# Patient Record
Sex: Male | Born: 2000 | Race: White | Hispanic: No | Marital: Single | Smoking: Never smoker
Health system: Southern US, Community
[De-identification: ages and names within clinical notes are randomized; demographics above are authoritative.]

## PROBLEM LIST (undated history)

## (undated) DIAGNOSIS — F909 Attention-deficit hyperactivity disorder, unspecified type: Secondary | ICD-10-CM

## (undated) DIAGNOSIS — K219 Gastro-esophageal reflux disease without esophagitis: Secondary | ICD-10-CM

## (undated) HISTORY — DX: Gastro-esophageal reflux disease without esophagitis: K21.9

## (undated) HISTORY — DX: Attention-deficit hyperactivity disorder, unspecified type: F90.9

## (undated) HISTORY — PX: OTHER SURGICAL HISTORY: SHX169

---
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
## 2020-01-26 ENCOUNTER — Ambulatory Visit: Payer: BC Managed Care – PPO

## 2020-06-19 ENCOUNTER — Telehealth: Payer: Self-pay | Admitting: Nurse Practitioner

## 2020-06-19 NOTE — Telephone Encounter (Signed)
Patients mother advised to treat the symptoms allergy med for drainage, throat spray. Can rotate ibuprofen and tylenol . Told to call us back if no better.

## 2020-06-19 NOTE — Telephone Encounter (Signed)
Lmtcb.

## 2021-07-23 ENCOUNTER — Ambulatory Visit: Payer: BC Managed Care – PPO | Admitting: Family

## 2021-07-23 ENCOUNTER — Encounter: Payer: Self-pay | Admitting: Family

## 2021-07-23 ENCOUNTER — Other Ambulatory Visit: Payer: Self-pay

## 2021-07-23 VITALS — BP 108/75 | HR 88 | Temp 97.8°F | Ht 69.0 in | Wt 189.2 lb

## 2021-07-23 DIAGNOSIS — L255 Unspecified contact dermatitis due to plants, except food: Secondary | ICD-10-CM

## 2021-07-23 MED ORDER — METHYLPREDNISOLONE ACETATE 80 MG/ML IJ SUSP
80.0000 mg | Freq: Once | INTRAMUSCULAR | Status: AC
  Administered 2021-07-23: 80 mg via INTRAMUSCULAR

## 2021-07-23 MED ORDER — TRIAMCINOLONE ACETONIDE 0.5 % EX OINT: 1.0000 "application " | TOPICAL_OINTMENT | Freq: Two times a day (BID) | CUTANEOUS | 0 refills | Status: AC

## 2021-07-23 NOTE — Patient Instructions (Addendum)
Poison Oak Dermatitis °Poison oak dermatitis is inflammation of the skin that is caused by contact with the chemicals in the leaves of the poison oak (Toxicodendron) plant. The skin reaction often includes redness, swelling, blisters, and extreme itching. °What are the causes? °This condition is caused by a specific chemical (urushiol) that is found in the sap of the poison oak plant. This chemical is sticky and can be easily spread to people, animals, and objects. You can get poison oak dermatitis by: °Having direct contact with a poison oak plant. °Touching animals, other people, or objects that have come in contact with poison oak and have the chemical on them. °What increases the risk? °This condition is more likely to develop in people who: °Are outdoors often in wooded or marshy areas. °Go outdoors without wearing protective clothing, such as closed shoes, long pants, and a long-sleeved shirt. °What are the signs or symptoms? °Symptoms of this condition include: °Redness of the skin. °Extreme itching. °A rash that often includes bumps and blisters. The rash usually appears 48 hours after exposure if you have been exposed before. If this is the first time you have been exposed, the rash may not appear until a week after exposure. °Swelling. This may occur if the reaction is more severe. °Symptoms usually last for 1-2 weeks. However, the first time you develop this condition, symptoms may last 3-4 weeks. °How is this diagnosed? °This condition may be diagnosed based on your symptoms and a physical exam. Your health care provider may also ask you about any recent outdoor activity. °How is this treated? °Treatment for this condition will vary depending on how severe it is. Treatment may include: °Hydrocortisone creams or calamine lotions to relieve itching. °Oatmeal baths to soothe the skin. °Over-the-counter antihistamine medicines to help reduce itching. °Steroid medicine taken by mouth (orally) for more severe  reactions. °Follow these instructions at home: °Medicines °Take or apply over-the-counter and prescription medicines only as told by your health care provider. °Use hydrocortisone creams or calamine lotion as needed to soothe the skin and relieve itching. °General instructions °Do not scratch or rub your skin. °Apply a cold, wet cloth (cold compress) to the affected areas or take baths in cool water. This will help with itching. Avoid hot baths and showers. °Take oatmeal baths as needed. Use colloidal oatmeal. You can get this at your local pharmacy or grocery store. Follow the instructions on the packaging. °While you have the rash, wash clothes right after you wear them. °Keep all follow-up visits as told by your health care provider. This is important. °How is this prevented? ° °Learn to identify the poison oak plant and avoid contact with the plant. This plant can be recognized by the number of leaves. Generally, poison oak has three leaves with flowering branches on a single stem. The leaves are often a bit fuzzy and have a toothlike edge. °If you have been exposed to poison oak, thoroughly wash your skin with soap and water right away. You have about 30 minutes to remove the plant resin before it will cause the rash. Be sure to wash under your fingernails because any plant resin there will continue to spread the rash. °When hiking or camping, wear clothes that will help you avoid exposure on the skin. This includes long pants, a long-sleeved shirt, tall socks, and hiking boots. You can also apply preventive lotion to your skin to help limit exposure. °If you suspect that your clothes or outdoor gear came in contact with   poison oak, rinse them off outside with a garden hose before bringing them inside your house. °When doing yard work or gardening, wear gloves, long sleeves, long pants, and boots. Wash your garden tools and gloves if they come in contact with poison oak. °If you suspect that your pet has come  into contact with poison oak, wash him or her with pet shampoo and water. Make sure you wear gloves while washing your pet. °Do not burn poison oak plants. This can release the chemical from the plant into the air and may cause a reaction on the skin or eyes, or in the lungs from breathing in the smoke. °Contact a health care provider if you have: °Open sores in the rash area. °More redness, swelling, or pain in the affected area. °Redness that spreads beyond the rash area. °Fluid, blood, or pus coming from the affected area. °A fever. °A rash over a large area of your body. °A rash on your eyes, mouth, or genitals. °A rash that does not improve after a few weeks. °Get help right away if: °Your face swells or your eyes swell shut. °You have trouble breathing. °You have trouble swallowing. °These symptoms may represent a serious problem that is an emergency. Do not wait to see if the symptoms will go away. Get medical help right away. Call your local emergency services (911 in the [place] Do not drive yourself to the hospital. °Summary °Poison oak dermatitis is inflammation of the skin that is caused by contact with the chemicals on the leaves of the poison oak (Toxicodendron) plant. °Symptoms of this condition include redness, extreme itching, a rash, and swelling. °Do not scratch or rub your skin. °Take or apply over-the-counter and prescription medicines only as told by your health care provider. °This information is not intended to replace advice given to you by your health care provider. Make sure you discuss any questions you have with your health care provider. °Document Revised: 02/19/2019 Document Reviewed: 11/27/2018 °Elsevier Patient Education © 2022 [employer] ° °

## 2021-07-23 NOTE — Progress Notes (Signed)
Subjective:    Patient ID: Ronnie [name], male    DOB: 09-Sep-2001, 20 y.o.   MRN: [id number]  Chief Complaint  Patient presents with   Poison Elinor Dodge it last Monday     Poison Lajoyce [place] This is a new problem. The current episode started 1 to 4 weeks ago. The problem has been gradually worsening since onset. The affected locations include the left wrist, right wrist, left ankle, right buttock and right ankle. The rash is characterized by redness and itchiness. He was exposed to plant contact. Pertinent negatives include no congestion, cough, fatigue, fever or shortness of breath. Past treatments include topical steroids and anti-itch cream. The treatment provided mild relief.     Review of Systems  Constitutional:  Negative for fatigue and fever.  HENT:  Negative for congestion.   Respiratory:  Negative for cough and shortness of breath.   All other systems reviewed and are negative.     Objective:   Physical Exam Vitals reviewed.  Constitutional:      General: He is not in acute distress.    Appearance: He is well-developed.  HENT:     Head: Normocephalic.     Right Ear: Tympanic membrane normal.     Left Ear: Tympanic membrane normal.  Eyes:     General:        Right eye: No discharge.        Left eye: No discharge.     Pupils: Pupils are equal, round, and reactive to light.  Neck:     Thyroid: No thyromegaly.  Cardiovascular:     Rate and Rhythm: Normal rate and regular rhythm.     Heart sounds: Normal heart sounds. No murmur heard. Pulmonary:     Effort: Pulmonary effort is normal. No respiratory distress.     Breath sounds: Normal breath sounds. No wheezing.  Abdominal:     General: Bowel sounds are normal. There is no distension.     Palpations: Abdomen is soft.     Tenderness: There is no abdominal tenderness.  Musculoskeletal:        General: No tenderness. Normal range of motion.     Cervical back: Normal range of motion and neck supple.  Skin:     General: Skin is warm and dry.     Findings: Erythema and rash present.          Comments: Erythemas rash on bilateral knees, ankles, and wrist  Neurological:     Mental Status: He is alert and oriented to person, place, and time.     Cranial Nerves: No cranial nerve deficit.     Deep Tendon Reflexes: Reflexes are normal and symmetric.  Psychiatric:        Behavior: Behavior normal.        Thought Content: Thought content normal.        Judgment: Judgment normal.     BP 108/75   Pulse 88   Temp 97.8 F (36.6 C) (Temporal)   Ht 5\' 9"  (1.753 m)   Wt 189 lb 3.2 oz (85.8 kg)   BMI 27.94 kg/m       Assessment & Plan:  Tawfiq Favila comes in today with chief complaint of Poison Ivy (Ot it last Monday )   Diagnosis and orders addressed:  1. Contact dermatitis due to plant Avoid scratching  -Wear protective clothing while outside- Long sleeves and long pants -Take a shower as soon as possible after being outside Report if  area worsens or does not improve  - triamcinolone ointment (KENALOG) 0.5 %; Apply 1 application topically 2 (two) times daily.  Dispense: 30 g; Refill: 0 - methylPREDNISolone acetate (DEPO-MEDROL) injection 80 mg   Jannifer Rodney, FNP

## 2022-10-25 ENCOUNTER — Other Ambulatory Visit: Payer: Self-pay | Admitting: Nurse Practitioner

## 2022-10-25 MED ORDER — OSELTAMIVIR PHOSPHATE 75 MG PO CAPS: 75.0000 mg | ORAL_CAPSULE | Freq: Every day | ORAL | 0 refills | Status: AC

## 2022-10-25 NOTE — Progress Notes (Cosign Needed)
Flu exposure
